# Patient Record
Sex: Male | Born: 1980 | Race: Black or African American | Hispanic: No
Health system: Southern US, Community
[De-identification: ages and names within clinical notes are randomized; demographics above are authoritative.]

## PROBLEM LIST (undated history)

## (undated) DIAGNOSIS — J45909 Unspecified asthma, uncomplicated: Secondary | ICD-10-CM

## (undated) HISTORY — DX: Unspecified asthma, uncomplicated: J45.909

---

## 2019-10-28 DIAGNOSIS — R0602 Shortness of breath: Secondary | ICD-10-CM | POA: Diagnosis present

## 2019-10-28 DIAGNOSIS — J4521 Mild intermittent asthma with (acute) exacerbation: Secondary | ICD-10-CM | POA: Insufficient documentation

## 2019-10-29 ENCOUNTER — Emergency Department (HOSPITAL_COMMUNITY)
Admission: EM | Admit: 2019-10-29 | Discharge: 2019-10-29 | Disposition: A | Discharge status: Home / Self Care | Attending: Emergency Medicine | Admitting: Emergency Medicine

## 2019-10-29 ENCOUNTER — Encounter (HOSPITAL_COMMUNITY): Payer: Self-pay

## 2019-10-29 ENCOUNTER — Other Ambulatory Visit: Payer: Self-pay

## 2019-10-29 DIAGNOSIS — J4521 Mild intermittent asthma with (acute) exacerbation: Secondary | ICD-10-CM

## 2019-10-29 MED ORDER — ALBUTEROL SULFATE HFA 108 (90 BASE) MCG/ACT IN AERS
2.0000 | INHALATION_SPRAY | Freq: Four times a day (QID) | RESPIRATORY_TRACT | Status: DC | PRN
  Administered 2019-10-29: 2 via RESPIRATORY_TRACT
  Filled 2019-10-29: qty 6.7

## 2019-10-29 NOTE — ED Notes (Signed)
Patient verbalizes understanding of discharge instructions. Opportunity for questioning and answers were provided. Armband removed by staff, pt discharged from ED ambulatory.   

## 2019-10-29 NOTE — ED Triage Notes (Signed)
Patient complains of being bothered by his asthma for 2 days, speaking complete sentences, NAD

## 2019-10-29 NOTE — ED Provider Notes (Signed)
[facility] EMERGENCY DEPARTMENT Provider Note   CSN: [id number] Arrival date & time: 10/28/19  2249     History No chief complaint on file.   Charles Oconnell is a 39 y.o. male.  Patient is a 39 year old male with a history of asthma who presents here with an asthma exacerbation.  He said that when he arrived at the ED yesterday, he had had some wheezing and shortness of breath.  His symptoms have subsequently resolved.  He says he has had some congestion for about 2 days with a little mucus and some rhinorrhea.  No significant chest congestion.  No productive cough.  No fevers.  No other recent illnesses.  No associated chest pain.  He said it felt tight while he was having the asthma exacerbation yesterday but no current chest pain or shortness of breath.  He says is been a while since he has had any trouble with his asthma and does not currently have any albuterol or other medications for his asthma.        Past Medical History:  Diagnosis Date  . Asthma     There are no problems to display for this patient.   History reviewed. No pertinent surgical history.     No family history on file.  Social History   Tobacco Use  . Smoking status: Not on file  Substance Use Topics  . Alcohol use: Not on file  . Drug use: Not on file    Home Medications Prior to Admission medications   Not on File    Allergies    Patient has no known allergies.  Review of Systems   Review of Systems  Constitutional: Negative for chills, diaphoresis, fatigue and fever.  HENT: Positive for congestion and rhinorrhea. Negative for sneezing.   Eyes: Negative.   Respiratory: Positive for cough, shortness of breath and wheezing. Negative for chest tightness.   Cardiovascular: Negative for chest pain and leg swelling.  Gastrointestinal: Negative for abdominal pain, blood in stool, diarrhea, nausea and vomiting.  Genitourinary: Negative for difficulty urinating, flank pain,  frequency and hematuria.  Musculoskeletal: Negative for arthralgias and back pain.  Skin: Negative for rash.  Neurological: Negative for dizziness, speech difficulty, weakness, numbness and headaches.    Physical Exam Updated Vital Signs BP 110/71 (BP Location: Right Arm)   Pulse 79   Temp 99.5 F (37.5 C) (Oral)   Resp 15   SpO2 98%   Physical Exam Constitutional:      Appearance: He is well-developed.  HENT:     Head: Normocephalic and atraumatic.  Eyes:     Pupils: Pupils are equal, round, and reactive to light.  Cardiovascular:     Rate and Rhythm: Normal rate and regular rhythm.     Heart sounds: Normal heart sounds.  Pulmonary:     Effort: Pulmonary effort is normal. No respiratory distress.     Breath sounds: Normal breath sounds. No wheezing or rales.  Chest:     Chest wall: No tenderness.  Abdominal:     General: Bowel sounds are normal.     Palpations: Abdomen is soft.     Tenderness: There is no abdominal tenderness. There is no guarding or rebound.  Musculoskeletal:        General: Normal range of motion.     Cervical back: Normal range of motion and neck supple.  Lymphadenopathy:     Cervical: No cervical adenopathy.  Skin:    General: Skin is warm and  dry.     Findings: No rash.  Neurological:     Mental Status: He is alert and oriented to person, place, and time.     ED Results / Procedures / Treatments   Labs (all labs ordered are listed, but only abnormal results are displayed) Labs Reviewed - No data to display  EKG None  Radiology No results found.  Procedures Procedures (including critical care time)  Medications Ordered in ED Medications  albuterol (VENTOLIN HFA) 108 (90 Base) MCG/ACT inhaler 2 puff (has no administration in time range)    ED Course  I have reviewed the triage vital signs and the nursing notes.  Pertinent labs & imaging results that were available during my care of the patient were reviewed by me and considered  in my medical decision making (see chart for details).    MDM Rules/Calculators/A&P                         Patient is a 39 year old male who presents with an asthma exacerbation.  He actually checked in yesterday and had an extended wait time in the emergency department waiting room.  When I saw him, he was asymptomatic.  His lungs are clear.  He has had some minor congestion symptoms which may have triggered his asthma.  He is not currently having symptoms he has not had any symptoms since yesterday evening.  He says he did get a Covid test at a drive-through testing site yesterday and this is still pending.  He was advised to quarantine until his Covid test returns.  He was dispensed an albuterol inhaler.  He does not have any clinical suggestions of pneumonia.  Return precautions were given.  Final Clinical Impression(s) / ED Diagnoses Final diagnoses:  Mild intermittent asthma with exacerbation    Rx / DC Orders ED Discharge Orders    None       Rolan Bucco, MD 10/29/19 1401

## 2019-12-15 ENCOUNTER — Emergency Department (HOSPITAL_COMMUNITY)
Admission: EM | Admit: 2019-12-15 | Discharge: 2019-12-16 | Disposition: A | Discharge status: Home / Self Care | Payer: Self-pay | Attending: Emergency Medicine | Admitting: Emergency Medicine

## 2019-12-15 ENCOUNTER — Encounter (HOSPITAL_COMMUNITY): Payer: Self-pay

## 2019-12-15 DIAGNOSIS — J45909 Unspecified asthma, uncomplicated: Secondary | ICD-10-CM | POA: Insufficient documentation

## 2019-12-15 DIAGNOSIS — W19XXXA Unspecified fall, initial encounter: Secondary | ICD-10-CM | POA: Insufficient documentation

## 2019-12-15 DIAGNOSIS — W231XXA Caught, crushed, jammed, or pinched between stationary objects, initial encounter: Secondary | ICD-10-CM | POA: Insufficient documentation

## 2019-12-15 DIAGNOSIS — S62632A Displaced fracture of distal phalanx of right middle finger, initial encounter for closed fracture: Secondary | ICD-10-CM | POA: Insufficient documentation

## 2019-12-15 NOTE — ED Triage Notes (Signed)
Pt states that he jammed his R middle finger and does not know what he did to it

## 2019-12-16 ENCOUNTER — Emergency Department (HOSPITAL_COMMUNITY): Payer: Self-pay

## 2019-12-16 DIAGNOSIS — S62632A Displaced fracture of distal phalanx of right middle finger, initial encounter for closed fracture: Secondary | ICD-10-CM | POA: Diagnosis present

## 2019-12-16 MED ORDER — LIDOCAINE HCL 2 % IJ SOLN: 5.0000 mL | Freq: Once | INTRAMUSCULAR | Status: DC

## 2019-12-16 MED ORDER — NAPROXEN 250 MG PO TABS
500.0000 mg | ORAL_TABLET | Freq: Once | ORAL | Status: AC
  Administered 2019-12-16: 500 mg via ORAL
  Filled 2019-12-16: qty 2

## 2019-12-16 MED ORDER — NAPROXEN 500 MG PO TABS: 500.0000 mg | ORAL_TABLET | Freq: Two times a day (BID) | ORAL | 0 refills | Status: AC

## 2019-12-16 MED ORDER — OXYCODONE-ACETAMINOPHEN 5-325 MG PO TABS
1.0000 | ORAL_TABLET | Freq: Once | ORAL | Status: AC
  Administered 2019-12-16: 1 via ORAL
  Filled 2019-12-16: qty 1

## 2019-12-16 NOTE — Progress Notes (Signed)
Orthopedic Tech Progress Note Patient Details:  Holland Nickson 1980-07-25 [id number]  Ortho Devices Type of Ortho Device: Finger splint Ortho Device/Splint Location: RUE Ortho Device/Splint Interventions: Ordered, Application, Adjustment   Post Interventions Patient Tolerated: Well Instructions Provided: Care of device, Adjustment of device, Poper ambulation with device   Shaunice Levitan 12/16/2019, 8:35 AM

## 2019-12-16 NOTE — Discharge Instructions (Addendum)
Take the medications as needed to help with pain. Follow-up with the hand specialist listed below. Return to the ER if you start to experience worsening pain, additional injuries or numbness.

## 2019-12-16 NOTE — ED Notes (Signed)
Ortho at bedside.

## 2019-12-16 NOTE — ED Provider Notes (Signed)
[facility]GENC[facility] Provider Note   CSN: [id number] Arrival date & time: 12/15/19  2312     History Chief Complaint  Patient presents with   Hand Pain    [facility] is a 39 y.o. male who presents to ED with a chief complaint of dominant right third digit pain.  States that he accidentally fell and jammed his finger about 4 days ago.  He has not taken any medications to help with pain.  Denies any numbness or weakness.  No prior fracture, dislocations or procedures in the area.  No other injuries from the fall.  HPI     Past Medical History:  Diagnosis Date   Asthma     Patient Active Problem List   Diagnosis Date Noted   Closed displaced fracture of distal phalanx of right middle finger 12/16/2019    History reviewed. No pertinent surgical history.     No family history on file.  Social History   Tobacco Use   Smoking status: Not on file  Substance Use Topics   Alcohol use: Not on file   Drug use: Not on file    Home Medications Prior to Admission medications   Medication Sig Start Date End Date Taking? Authorizing Provider  naproxen (NAPROSYN) 500 MG tablet Take 1 tablet (500 mg total) by mouth 2 (two) times daily. 12/16/19   Dietrich Pates, PA-C    Allergies    Patient has no known allergies.  Review of Systems   Review of Systems  Constitutional: Negative for chills and fever.  Musculoskeletal: Positive for arthralgias and joint swelling.  Neurological: Negative for weakness and numbness.    Physical Exam Updated Vital Signs BP 125/89    Pulse 92    Temp 98.6 F (37 C) (Oral)    Resp 16    Ht 5\' 5"  (1.651 m)    Wt 70.3 kg    SpO2 100%    BMI 25.79 kg/m   Physical Exam Vitals and nursing note reviewed.  Constitutional:      General: He is not in acute distress.    Appearance: He is well-developed. He is not diaphoretic.  HENT:     Head: Normocephalic and atraumatic.  Eyes:     General: No scleral  icterus.    Conjunctiva/sclera: Conjunctivae normal.  Pulmonary:     Effort: Pulmonary effort is normal. No respiratory distress.  Musculoskeletal:        General: Swelling and tenderness present.     Cervical back: Normal range of motion.     Comments: Tenderness, swelling of the right third digit DIP joint.  Normal sensation to light touch.  2+ radial pulse noted.  Pain with flexion of this joint.  Otherwise normal range of motion of digit and hand.  No overlying skin changes or lacerations.  Skin:    Findings: No rash.  Neurological:     Mental Status: He is alert.     ED Results / Procedures / Treatments   Labs (all labs ordered are listed, but only abnormal results are displayed) Labs Reviewed - No data to display  EKG None  Radiology DG Finger Middle Right  Result Date: 12/16/2019 CLINICAL DATA:  Pain EXAM: RIGHT MIDDLE FINGER 2+V COMPARISON:  None. FINDINGS: There is an acute displaced fracture through the base of the distal phalanx. There is mild subluxation at the distal interphalangeal joint. There is surrounding soft tissue swelling. IMPRESSION: Acute displaced fracture through the base of the distal  phalanx. There appears to be some subluxation of the distal interphalangeal joint. Electronically Signed   By: Katherine Mantle M.D.   On: 12/16/2019 00:53    Procedures Procedures (including critical care time)  Medications Ordered in ED Medications  oxyCODONE-acetaminophen (PERCOCET/ROXICET) 5-325 MG per tablet 1 tablet (1 tablet Oral Given 12/16/19 0828)  naproxen (NAPROSYN) tablet 500 mg (500 mg Oral Given 12/16/19 1950)    ED Course  I have reviewed the triage vital signs and the nursing notes.  Pertinent labs & imaging results that were available during my care of the patient were reviewed by me and considered in my medical decision making (see chart for details).    MDM Rules/Calculators/A&P                          39 year old male presenting to the ED  after injuring his right third digit 4 days ago.  X-ray shows fracture of the base of the distal phalanx of the right third digit.  There is soft tissue swelling which is noticeable on exam.  Normal sensation to light touch.  Will place in finger splint.  Attempted to realign joint however patient unable to tolerate due to swelling and pain. Will give ortho f/u.  All imaging, if done today, including plain films, CT scans, and ultrasounds, independently reviewed by me, and interpretations confirmed via formal radiology reads.  Patient is hemodynamically stable, in NAD, and able to ambulate in the ED. Evaluation does not show pathology that would require ongoing emergent intervention or inpatient treatment. I explained the diagnosis to the patient. Pain has been managed and has no complaints prior to discharge. Patient is comfortable with above plan and is stable for discharge at this time. All questions were answered prior to disposition. Strict return precautions for returning to the ED were discussed. Encouraged follow up with PCP.   An After Visit Summary was printed and given to the patient.   Portions of this note were generated with Scientist, clinical (histocompatibility and immunogenetics). Dictation errors may occur despite best attempts at proofreading.  Final Clinical Impression(s) / ED Diagnoses Final diagnoses:  Closed displaced fracture of distal phalanx of right middle finger, initial encounter    Rx / DC Orders ED Discharge Orders         Ordered    naproxen (NAPROSYN) 500 MG tablet  2 times daily        12/16/19 0843           Dietrich Pates, PA-C 12/16/19 0843    Wynetta Fines, MD 12/17/19 [phone]

## 2019-12-16 NOTE — ED Notes (Signed)
Patient verbalizes understanding of discharge instructions. Opportunity for questioning and answers were provided. Pt discharged from ED. 

## 2019-12-16 NOTE — ED Notes (Signed)
Ortho paged. 

## 2021-01-10 IMAGING — DX DG FINGER MIDDLE 2+V*R*
3 series · 3 of 3 positions shown · non-contrast
Comparison: None.

CLINICAL DATA: Pain

EXAM:
RIGHT MIDDLE FINGER 2+V

[finger ap]
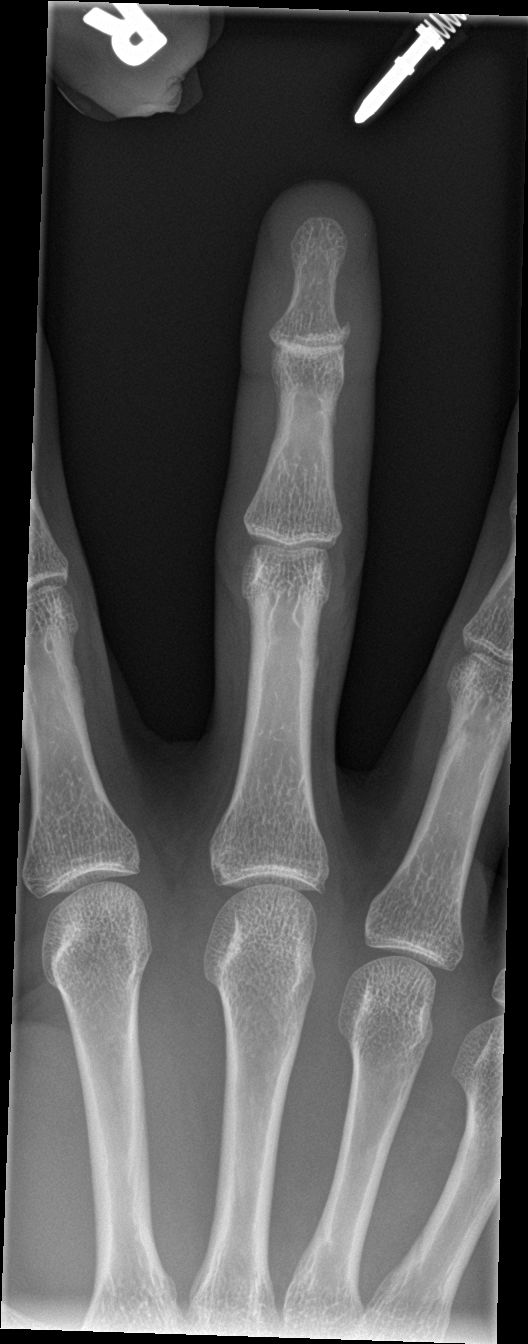

[finger obl]
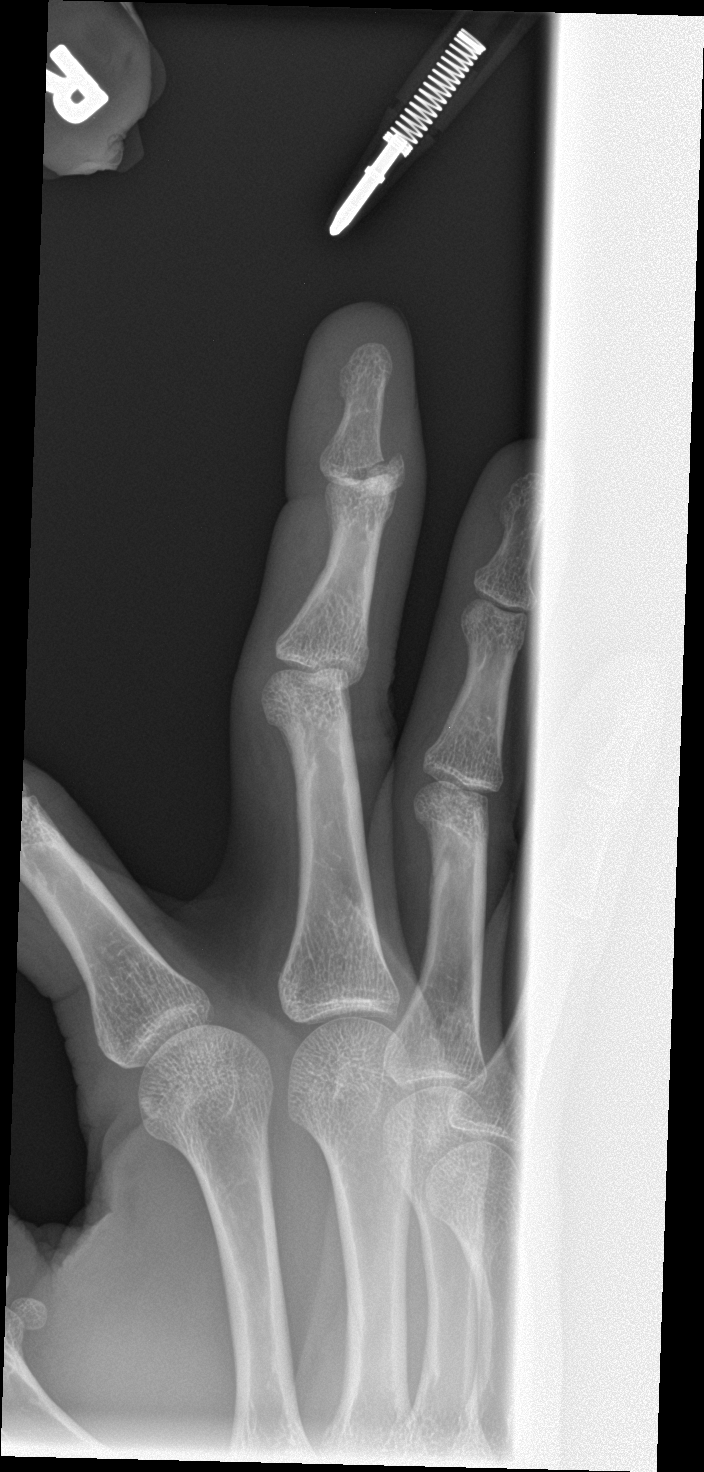

[finger lat]
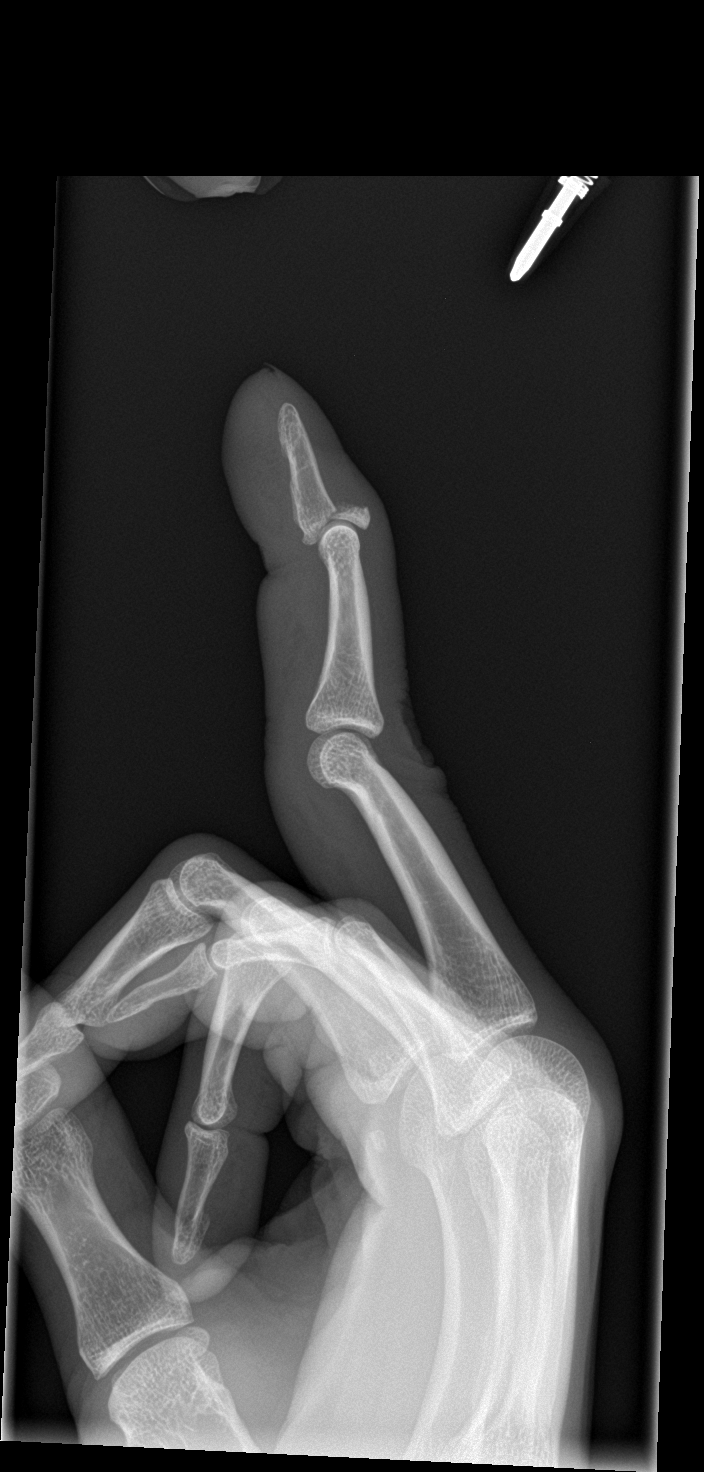

[3 of 3 positions shown; findings below may reference images not displayed]

FINDINGS: There is an acute displaced fracture through the base of the distal
phalanx. There is mild subluxation at the distal interphalangeal
joint. There is surrounding soft tissue swelling.
IMPRESSION: Acute displaced fracture through the base of the distal phalanx.
There appears to be some subluxation of the distal interphalangeal
joint.
